# Patient Record
Sex: Male | Born: 2005 | Race: Black or African American | Hispanic: No | Marital: Single | Smoking: Never smoker
Health system: Southern US, Community
[De-identification: ages and names within clinical notes are randomized; demographics above are authoritative.]

## PROBLEM LIST (undated history)

## (undated) DIAGNOSIS — R0989 Other specified symptoms and signs involving the circulatory and respiratory systems: Secondary | ICD-10-CM

## (undated) HISTORY — PX: NO PAST SURGERIES: SHX2092

---
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
## 2023-02-03 ENCOUNTER — Ambulatory Visit: Payer: MEDICAID | Admitting: Occupational Therapy

## 2023-02-03 ENCOUNTER — Ambulatory Visit: Payer: MEDICAID

## 2023-02-11 ENCOUNTER — Ambulatory Visit: Payer: MEDICAID | Admitting: Occupational Therapy

## 2023-02-11 ENCOUNTER — Ambulatory Visit: Payer: MEDICAID | Attending: Pediatrics
